# Patient Record
Sex: Female | Born: 1965 | Race: White | Hispanic: No | Marital: Married | Smoking: Never smoker
Health system: Southern US, Community
[De-identification: ages and names within clinical notes are randomized; demographics above are authoritative.]

## PROBLEM LIST (undated history)

## (undated) DIAGNOSIS — D649 Anemia, unspecified: Secondary | ICD-10-CM

## (undated) DIAGNOSIS — R87619 Unspecified abnormal cytological findings in specimens from cervix uteri: Secondary | ICD-10-CM

## (undated) HISTORY — DX: Unspecified abnormal cytological findings in specimens from cervix uteri: R87.619

## (undated) HISTORY — PX: TUBAL LIGATION: SHX77

## (undated) HISTORY — PX: CERVICAL DISC SURGERY: SHX588

## (undated) HISTORY — DX: Anemia, unspecified: D64.9

---

## 2004-11-21 ENCOUNTER — Emergency Department (HOSPITAL_COMMUNITY): Admission: EM | Admit: 2004-11-21 | Discharge: 2004-11-21 | Payer: Self-pay | Admitting: Emergency Medicine

## 2005-03-06 ENCOUNTER — Ambulatory Visit (HOSPITAL_COMMUNITY): Admission: RE | Admit: 2005-03-06 | Discharge: 2005-03-07 | Payer: Self-pay | Admitting: Neurological Surgery

## 2005-12-26 ENCOUNTER — Ambulatory Visit: Payer: Self-pay | Admitting: Physician Assistant

## 2015-03-17 ENCOUNTER — Other Ambulatory Visit: Payer: Self-pay | Admitting: Obstetrics and Gynecology

## 2015-03-17 ENCOUNTER — Ambulatory Visit (INDEPENDENT_AMBULATORY_CARE_PROVIDER_SITE_OTHER): Payer: BLUE CROSS/BLUE SHIELD | Admitting: Obstetrics and Gynecology

## 2015-03-17 ENCOUNTER — Encounter: Payer: Self-pay | Admitting: Obstetrics and Gynecology

## 2015-03-17 VITALS — BP 146/88 | HR 90 | Ht 64.0 in | Wt 238.9 lb

## 2015-03-17 DIAGNOSIS — Z23 Encounter for immunization: Secondary | ICD-10-CM | POA: Diagnosis not present

## 2015-03-17 DIAGNOSIS — Z01419 Encounter for gynecological examination (general) (routine) without abnormal findings: Secondary | ICD-10-CM

## 2015-03-17 DIAGNOSIS — E669 Obesity, unspecified: Secondary | ICD-10-CM

## 2015-03-17 DIAGNOSIS — Z8041 Family history of malignant neoplasm of ovary: Secondary | ICD-10-CM

## 2015-03-17 MED ORDER — INFLUENZA VAC SPLIT QUAD 0.5 ML IM SUSY
0.5000 mL | PREFILLED_SYRINGE | Freq: Once | INTRAMUSCULAR | Status: AC
  Administered 2015-03-17: 0.5 mL via INTRAMUSCULAR

## 2015-03-17 NOTE — Addendum Note (Signed)
Addended by: Rosine BeatLONTZ, AMY L on: 03/17/2015 10:54 AM   Modules accepted: Orders

## 2015-03-17 NOTE — Progress Notes (Signed)
Subjective:   Nicole Chung is a 49 y.o. 633P0 Caucasian female here for a routine well-woman exam.  Patient's last menstrual period was 02/18/2015 (exact date).    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  Past Medical History Past Medical History  Diagnosis Date  . Anemia   . Abnormal Pap smear of cervix     yrs ago    Past Surgical History Past Surgical History  Procedure Laterality Date  . Cervical disc surgery    . Cesarean section  [id number],2001  . Tubal ligation      Gynecologic History G3P0  Patient's last menstrual period was 02/18/2015 (exact date). Contraception: tubal ligation Last Pap: unsure. Results were: normal- h/o abnormal in 4120s with cryo- normal since Last mammogram: years ago. Results were: normal  Obstetric History OB History  Gravida Para Term Preterm AB SAB TAB Ectopic Multiple Living  3         3    # Outcome Date GA Lbr Len/2nd Weight Sex Delivery Anes PTL Lv  3 Gravida 2001    F CS-Unspec   Y  2 Gravida 1994    M Vag-Spont   Y  1 Gravida 1989    M CS-Unspec   Y      Current Medications No current outpatient prescriptions on file prior to visit.   No current facility-administered medications on file prior to visit.    Review of Systems Patient denies any headaches, blurred vision, shortness of breath, chest pain, abdominal pain, problems with bowel movements, urination, or intercourse.  Objective:  BP 146/88 mmHg  Pulse 90  Ht 5\' 4"  (1.626 m)   Wt 238 lb 14.4 oz (108.364 kg)  BMI 40.99 kg/m2  LMP 02/18/2015 (Exact Date) Physical Exam  General:  Well developed, well nourished, no acute distress. She is alert and oriented x3. Skin:  Warm and dry Neck:  Midline trachea, no thyromegaly or nodules Cardiovascular: Regular rate and rhythm, no murmur heard Lungs:  Effort normal, all lung fields clear to auscultation bilaterally Breasts:  No dominant palpable mass, retraction, or nipple discharge Abdomen:  Soft, non tender, no hepatosplenomegaly or masses Pelvic:  External genitalia is normal in appearance.  The vagina is normal in appearance. The cervix is bulbous, no CMT.  Thin prep pap is done with HR HPV cotesting. Uterus is felt to be normal size, shape, and contour.  No adnexal masses or tenderness noted. Extremities:  No swelling or varicosities noted Psych:  She has a normal mood and affect  Assessment:   Healthy well-woman exam Obesity Family history of ovarian cancer in M[place], and breast cancer in Maternal second cousin Perimenopausal bleeding with heavy & irregular cycles  Plan:  Pap and labs obtained [facility] testing and info given to review with her mom. F/U 1 year for AE, or sooner if needed Mammogram scheduled  Audre Cenci Suzan NailerN Seanpaul Preece, CNM

## 2015-03-17 NOTE — Patient Instructions (Signed)
  Place annual gynecologic exam patient instructions here.  Thank you for enrolling in MyChart. Please follow the instructions below to securely access your online medical record. MyChart allows you to send messages to your doctor, view your test results, manage appointments, and more.   How Do I Sign Up? 1. In your Internet browser, go to [employer] Address Bar and enter [email or web address]. 2. Click on the Sign Up Now link in the Sign In box. You will see the New Member Sign Up page. 3. Enter your MyChart Access Code exactly as it appears below. You will not need to use this code after you've completed the sign-up process. If you do not sign up before the expiration date, you must request a new code.  MyChart Access Code: [email or web address] Expires: 03/22/2015  3:29 PM  4. Enter your Social Security Number ([id number]) and Date of Birth (mm/dd/yyyy) as indicated and click Submit. You will be taken to the next sign-up page. 5. Create a MyChart ID. This will be your MyChart login ID and cannot be changed, so think of one that is secure and easy to remember. 6. Create a MyChart password. You can change your password at any time. 7. Enter your Password Reset Question and Answer. This can be used at a later time if you forget your password.  8. Enter your e-mail address. You will receive e-mail notification when new information is available in MyChart. 9. Click Sign Up. You can now view your medical record.   Additional Information Remember, MyChart is NOT to be used for urgent needs. For medical emergencies, dial 911.

## 2015-03-18 LAB — COMPREHENSIVE METABOLIC PANEL
A/G RATIO: 1.7 (ref 1.1–2.5)
ALT: 12 IU/L (ref 0–32)
AST: 10 IU/L (ref 0–40)
Albumin: 4.3 g/dL (ref 3.5–5.5)
Alkaline Phosphatase: 63 IU/L (ref 39–117)
BUN/Creatinine Ratio: 15 (ref 9–23)
BUN: 10 mg/dL (ref 6–24)
Bilirubin Total: 0.2 mg/dL (ref 0.0–1.2)
CALCIUM: 8.9 mg/dL (ref 8.7–10.2)
CO2: 21 mmol/L (ref 18–29)
CREATININE: 0.66 mg/dL (ref 0.57–1.00)
Chloride: 102 mmol/L (ref 96–106)
GFR, EST AFRICAN AMERICAN: 119 mL/min/{1.73_m2} (ref >59)
GFR, EST NON AFRICAN AMERICAN: 103 mL/min/{1.73_m2} (ref >59)
GLOBULIN, TOTAL: 2.5 g/dL (ref 1.5–4.5)
Glucose: 94 mg/dL (ref 65–99)
POTASSIUM: 4.2 mmol/L (ref 3.5–5.2)
Sodium: 139 mmol/L (ref 134–144)
TOTAL PROTEIN: 6.8 g/dL (ref 6.0–8.5)

## 2015-03-18 LAB — HEMOGLOBIN A1C
Est. average glucose Bld gHb Est-mCnc: 126 mg/dL
Hgb A1c MFr Bld: 6 % — ABNORMAL HIGH (ref 4.8–5.6)

## 2015-03-18 LAB — CYTOLOGY - PAP

## 2015-03-18 LAB — VITAMIN D 25 HYDROXY (VIT D DEFICIENCY, FRACTURES): Vit D, 25-Hydroxy: 16.3 ng/mL — ABNORMAL LOW (ref 30.0–100.0)

## 2015-03-18 LAB — THYROID PANEL WITH TSH
FREE THYROXINE INDEX: 2.2 (ref 1.2–4.9)
T3 Uptake Ratio: 23 % — ABNORMAL LOW (ref 24–39)
T4, Total: 9.6 ug/dL (ref 4.5–12.0)
TSH: 1.61 u[IU]/mL (ref 0.450–4.500)

## 2015-03-18 LAB — FERRITIN: FERRITIN: 15 ng/mL (ref 15–150)

## 2015-03-19 ENCOUNTER — Ambulatory Visit
Admission: RE | Admit: 2015-03-19 | Discharge: 2015-03-19 | Disposition: A | Discharge status: Home / Self Care | Payer: BLUE CROSS/BLUE SHIELD | Source: Ambulatory Visit | Attending: Obstetrics and Gynecology | Admitting: Obstetrics and Gynecology

## 2015-03-19 DIAGNOSIS — Z1231 Encounter for screening mammogram for malignant neoplasm of breast: Secondary | ICD-10-CM | POA: Diagnosis not present

## 2015-03-19 DIAGNOSIS — Z01419 Encounter for gynecological examination (general) (routine) without abnormal findings: Secondary | ICD-10-CM

## 2015-03-24 ENCOUNTER — Other Ambulatory Visit: Payer: Self-pay | Admitting: Obstetrics and Gynecology

## 2015-03-24 DIAGNOSIS — R928 Other abnormal and inconclusive findings on diagnostic imaging of breast: Secondary | ICD-10-CM

## 2015-03-24 DIAGNOSIS — E559 Vitamin D deficiency, unspecified: Secondary | ICD-10-CM

## 2015-03-24 DIAGNOSIS — R7303 Prediabetes: Secondary | ICD-10-CM

## 2015-03-24 MED ORDER — VITAMIN D (ERGOCALCIFEROL) 1.25 MG (50000 UNIT) PO CAPS: 50000.0000 [IU] | ORAL_CAPSULE | ORAL | Status: DC

## 2015-03-29 ENCOUNTER — Ambulatory Visit
Admission: RE | Admit: 2015-03-29 | Discharge: 2015-03-29 | Disposition: A | Discharge status: Home / Self Care | Payer: BLUE CROSS/BLUE SHIELD | Source: Ambulatory Visit | Attending: Obstetrics and Gynecology | Admitting: Obstetrics and Gynecology

## 2015-03-29 ENCOUNTER — Other Ambulatory Visit: Payer: Self-pay | Admitting: Obstetrics and Gynecology

## 2015-03-29 DIAGNOSIS — R928 Other abnormal and inconclusive findings on diagnostic imaging of breast: Secondary | ICD-10-CM | POA: Diagnosis not present

## 2015-04-12 ENCOUNTER — Telehealth: Payer: Self-pay | Admitting: *Deleted

## 2015-04-12 NOTE — Telephone Encounter (Signed)
Notified pt of results and mailed all info to pt 

## 2015-04-12 NOTE — Telephone Encounter (Signed)
-----   Message from Purcell Nails, [place] sent at 03/24/2015 10:23 AM EST ----- Please mail her info on vit D def, and pre-diabetes. Let her know I have sent in rx for 2 times weekly vit D supplement. And a referral to [facility] for nutritional and pre-diabetes counseling. If we do not get her weight down and reduce her sugar intake she is at very high risk for full blown diabetes with-in 2 years. I want to check her labs in 3 months (order is in).  Also want her to exercise 3-4 x week for at least 30 minutes increasing to 45 mins. Reduce sugar intake.  Also make sure she has heard from [facility] and has scheduled her f/u [facility] on right side?? I put orders in.

## 2016-03-17 ENCOUNTER — Encounter: Payer: BLUE CROSS/BLUE SHIELD | Admitting: Obstetrics and Gynecology

## 2019-08-06 ENCOUNTER — Other Ambulatory Visit: Payer: Self-pay | Admitting: Internal Medicine

## 2019-08-06 DIAGNOSIS — Z1231 Encounter for screening mammogram for malignant neoplasm of breast: Secondary | ICD-10-CM

## 2019-08-08 ENCOUNTER — Ambulatory Visit
Admission: RE | Admit: 2019-08-08 | Discharge: 2019-08-08 | Disposition: A | Discharge status: Home / Self Care | Payer: BC Managed Care – PPO | Source: Ambulatory Visit | Attending: Internal Medicine | Admitting: Internal Medicine

## 2019-08-08 DIAGNOSIS — Z1231 Encounter for screening mammogram for malignant neoplasm of breast: Secondary | ICD-10-CM | POA: Diagnosis not present

## 2019-08-11 ENCOUNTER — Encounter: Payer: Self-pay | Admitting: Certified Nurse Midwife

## 2019-08-11 ENCOUNTER — Other Ambulatory Visit: Payer: Self-pay

## 2019-08-11 ENCOUNTER — Other Ambulatory Visit (HOSPITAL_COMMUNITY)
Admission: RE | Admit: 2019-08-11 | Discharge: 2019-08-11 | Disposition: A | Discharge status: Home / Self Care | Payer: BC Managed Care – PPO | Source: Ambulatory Visit | Attending: Certified Nurse Midwife | Admitting: Certified Nurse Midwife

## 2019-08-11 ENCOUNTER — Ambulatory Visit (INDEPENDENT_AMBULATORY_CARE_PROVIDER_SITE_OTHER): Payer: BC Managed Care – PPO | Admitting: Certified Nurse Midwife

## 2019-08-11 VITALS — BP 149/97 | HR 86 | Ht 64.0 in | Wt 237.5 lb

## 2019-08-11 DIAGNOSIS — Z124 Encounter for screening for malignant neoplasm of cervix: Secondary | ICD-10-CM | POA: Diagnosis present

## 2019-08-11 NOTE — Progress Notes (Signed)
GYN ENCOUNTER NOTE  Subjective:       Nicole Chung is a 54 y.o. G89P0 female is here for pap smear only.   Annual exam and labs to be completed by PCP.   Denies difficulty breathing or respiratory distress, chest pain, abdominal pain, excessive vaginal bleeding, dysuria, leg pain or swelling  Gynecologic History  Patient's last menstrual period was 02/18/2015 (exact date).   Contraception: post menopausal status   Last Pap: 03/17/2015. Results were: Neg/Neg  Last mammogram: 07/2019. Results were: inconclusive  Obstetric History  OB History  Gravida Para Term Preterm AB Living  3         3  SAB TAB Ectopic Multiple Live Births          3    # Outcome Date GA Lbr Len/2nd Weight Sex Delivery Anes PTL Lv  3 Gravida 2001    F CS-Unspec   LIV  2 Gravida 1994    M Vag-Spont   LIV  1 Gravida 1989    M CS-Unspec   LIV    Past Medical History:  Diagnosis Date  . Abnormal Pap smear of cervix    yrs ago  . Anemia     Past Surgical History:  Procedure Laterality Date  . CERVICAL DISC SURGERY    . CESAREAN SECTION  [id number]  . TUBAL LIGATION      Current Outpatient Medications on File Prior to Visit  Medication Sig Dispense Refill  . fexofenadine (ALLEGRA) 180 MG tablet Take 180 mg by mouth daily.    . Glucosamine HCl (GLUCOSAMINE PO) Take by mouth.    . Multiple Vitamin (MULTIVITAMIN) tablet Take 1 tablet by mouth daily.    . Vitamin D, Ergocalciferol, (DRISDOL) 50000 units CAPS capsule Take 1 capsule (50,000 Units total) by mouth 2 (two) times a week. 30 capsule 2   No current facility-administered medications on file prior to visit.    No Known Allergies  Social History   Socioeconomic History  . Marital status: Married    Spouse name: Not on file  . Number of children: Not on file  . Years of education: Not on file  . Highest education level: Not on file  Occupational History  . Not on file  Tobacco Use  . Smoking status: Never Smoker  . Smokeless  tobacco: Never Used  Substance and Sexual Activity  . Alcohol use: Yes    Comment: occas  . Drug use: No  . Sexual activity: Yes    Birth control/protection: Surgical  Other Topics Concern  . Not on file  Social History Narrative  . Not on file   Social Determinants of Health   Financial Resource Strain:   . Difficulty of Paying Living Expenses:   Food Insecurity:   . Worried About Programme researcher, broadcasting/film/video in the Last Year:   . Barista in the Last Year:   Transportation Needs:   . Freight forwarder (Medical):   Marland Kitchen Lack of Transportation (Non-Medical):   Physical Activity:   . Days of Exercise per Week:   . Minutes of Exercise per Session:   Stress:   . Feeling of Stress :   Social Connections:   . Frequency of Communication with Friends and Family:   . Frequency of Social Gatherings with Friends and Family:   . Attends Religious Services:   . Active Member of Clubs or Organizations:   . Attends Banker Meetings:   Marland Kitchen Marital Status:  Intimate Partner Violence:   . Fear of Current or Ex-Partner:   . Emotionally Abused:   Marland Kitchen Physically Abused:   . Sexually Abused:     Family History  Problem Relation Age of Onset  . Cancer Maternal Grandmother        ovarian  . Breast cancer Cousin   . Healthy Mother   . Lung cancer Father     The following portions of the patient's history were reviewed and updated as appropriate: allergies, current medications, past family history, past medical history, past social history, past surgical history and problem list.  Review of Systems  Review of Systems - negative except as noted above. Information obtained from patient.   Objective:   BP (!) 149/97   Pulse 86   Ht 5\' 4"  (1.626 m)   Wt 237 lb 8 oz (107.7 kg)   LMP 02/18/2015 (Exact Date)   BMI 40.77 kg/m    CONSTITUTIONAL: Well-developed, well-nourished female in no acute distress.   [place]: Alert and oriented to person, place, and time.    PSYCHIATRIC: Normal mood and affect. Normal behavior. Normal judgment and thought content.  PELVIC:  External Genitalia: Normal  Vagina: Normal  Cervix: Normal, Pap collected  Uterus: Normal size, shape,consistency, mobile  Adnexa: Normal   MUSCULOSKELETAL: Normal range of motion. No tenderness.  No cyanosis, clubbing, or edema.  Assessment:   1. Pap smear for cervical cancer screening  - Cytology - PAP    Plan:   Pap collected. See orders.  Discussed mammogram results. Encouraged patient to follow up as advised per radiologist.   Reviewed red flags and when to call the office.  RTC x 5 year for Pap smear or sooner if needed.   Fransico Him RN [place] 08/11/19 10:01 AM

## 2019-08-11 NOTE — Progress Notes (Signed)
I have seen, interviewed, and examined the patient in conjunction with the [employer] Nurse Practitioner student and affirm the diagnosis and management plan.   Gunnar Bulla, CNM [facility], [facility] 08/11/19 10:25 AM

## 2019-08-11 NOTE — Progress Notes (Signed)
Pt present for pap smear only. Pt has her annual exam with another provider. Pt stated that she was doing well no problems.

## 2019-08-11 NOTE — Patient Instructions (Signed)

## 2019-08-13 LAB — CYTOLOGY - PAP
Comment: NEGATIVE
Diagnosis: NEGATIVE
High risk HPV: NEGATIVE

## 2019-08-14 ENCOUNTER — Other Ambulatory Visit: Payer: Self-pay | Admitting: Internal Medicine

## 2019-08-14 DIAGNOSIS — R921 Mammographic calcification found on diagnostic imaging of breast: Secondary | ICD-10-CM

## 2019-08-14 DIAGNOSIS — R928 Other abnormal and inconclusive findings on diagnostic imaging of breast: Secondary | ICD-10-CM

## 2019-08-19 ENCOUNTER — Ambulatory Visit
Admission: RE | Admit: 2019-08-19 | Discharge: 2019-08-19 | Disposition: A | Discharge status: Home / Self Care | Payer: BC Managed Care – PPO | Source: Ambulatory Visit | Attending: Internal Medicine | Admitting: Internal Medicine

## 2019-08-19 DIAGNOSIS — R928 Other abnormal and inconclusive findings on diagnostic imaging of breast: Secondary | ICD-10-CM | POA: Diagnosis not present

## 2019-08-19 DIAGNOSIS — R921 Mammographic calcification found on diagnostic imaging of breast: Secondary | ICD-10-CM | POA: Diagnosis present

## 2019-08-21 ENCOUNTER — Other Ambulatory Visit: Payer: Self-pay | Admitting: Internal Medicine

## 2019-08-21 DIAGNOSIS — R921 Mammographic calcification found on diagnostic imaging of breast: Secondary | ICD-10-CM

## 2019-08-21 DIAGNOSIS — R928 Other abnormal and inconclusive findings on diagnostic imaging of breast: Secondary | ICD-10-CM

## 2019-08-28 ENCOUNTER — Ambulatory Visit
Admission: RE | Admit: 2019-08-28 | Discharge: 2019-08-28 | Disposition: A | Discharge status: Home / Self Care | Payer: BC Managed Care – PPO | Source: Ambulatory Visit | Attending: Internal Medicine | Admitting: Internal Medicine

## 2019-08-28 DIAGNOSIS — R928 Other abnormal and inconclusive findings on diagnostic imaging of breast: Secondary | ICD-10-CM | POA: Insufficient documentation

## 2019-08-28 DIAGNOSIS — R921 Mammographic calcification found on diagnostic imaging of breast: Secondary | ICD-10-CM | POA: Diagnosis present

## 2019-08-28 HISTORY — PX: BREAST BIOPSY: SHX20

## 2019-08-29 LAB — SURGICAL PATHOLOGY

## 2020-02-20 ENCOUNTER — Other Ambulatory Visit: Payer: Self-pay | Admitting: Internal Medicine

## 2020-02-20 DIAGNOSIS — R921 Mammographic calcification found on diagnostic imaging of breast: Secondary | ICD-10-CM

## 2020-04-08 ENCOUNTER — Ambulatory Visit
Admission: RE | Admit: 2020-04-08 | Discharge: 2020-04-08 | Disposition: A | Discharge status: Home / Self Care | Payer: BC Managed Care – PPO | Source: Ambulatory Visit | Attending: Internal Medicine | Admitting: Internal Medicine

## 2020-04-08 ENCOUNTER — Other Ambulatory Visit: Payer: Self-pay

## 2020-04-08 DIAGNOSIS — R921 Mammographic calcification found on diagnostic imaging of breast: Secondary | ICD-10-CM

## 2020-04-12 ENCOUNTER — Other Ambulatory Visit: Payer: Self-pay | Admitting: Internal Medicine

## 2020-04-12 DIAGNOSIS — R921 Mammographic calcification found on diagnostic imaging of breast: Secondary | ICD-10-CM

## 2020-05-20 LAB — EXTERNAL GENERIC LAB PROCEDURE: COLOGUARD: NEGATIVE

## 2020-09-13 ENCOUNTER — Ambulatory Visit
Admission: RE | Admit: 2020-09-13 | Discharge: 2020-09-13 | Disposition: A | Discharge status: Home / Self Care | Payer: BC Managed Care – PPO | Source: Ambulatory Visit | Attending: Internal Medicine | Admitting: Internal Medicine

## 2020-09-13 ENCOUNTER — Other Ambulatory Visit: Payer: Self-pay

## 2020-09-13 DIAGNOSIS — R921 Mammographic calcification found on diagnostic imaging of breast: Secondary | ICD-10-CM | POA: Diagnosis not present

## 2021-02-27 IMAGING — MG MM DIGITAL DIAGNOSTIC UNILAT*L* W/ TOMO W/ CAD
6 of 9 series · 6 of 21 positions shown · non-contrast
Comparison: Previous exam(s).

CLINICAL DATA: 54-year-old female presenting for six-month
follow-up of probably benign left breast calcifications. Patient had
one group of calcifications biopsied demonstrating benign pathology
in August 2019.

EXAM:
DIGITAL DIAGNOSTIC UNILATERAL LEFT MAMMOGRAM WITH TOMO AND CAD
TECHNIQUE: Left digital diagnostic mammography and breast tomosynthesis was
performed. Digital images of the breasts were evaluated with
computer-aided detection.

[L ML]
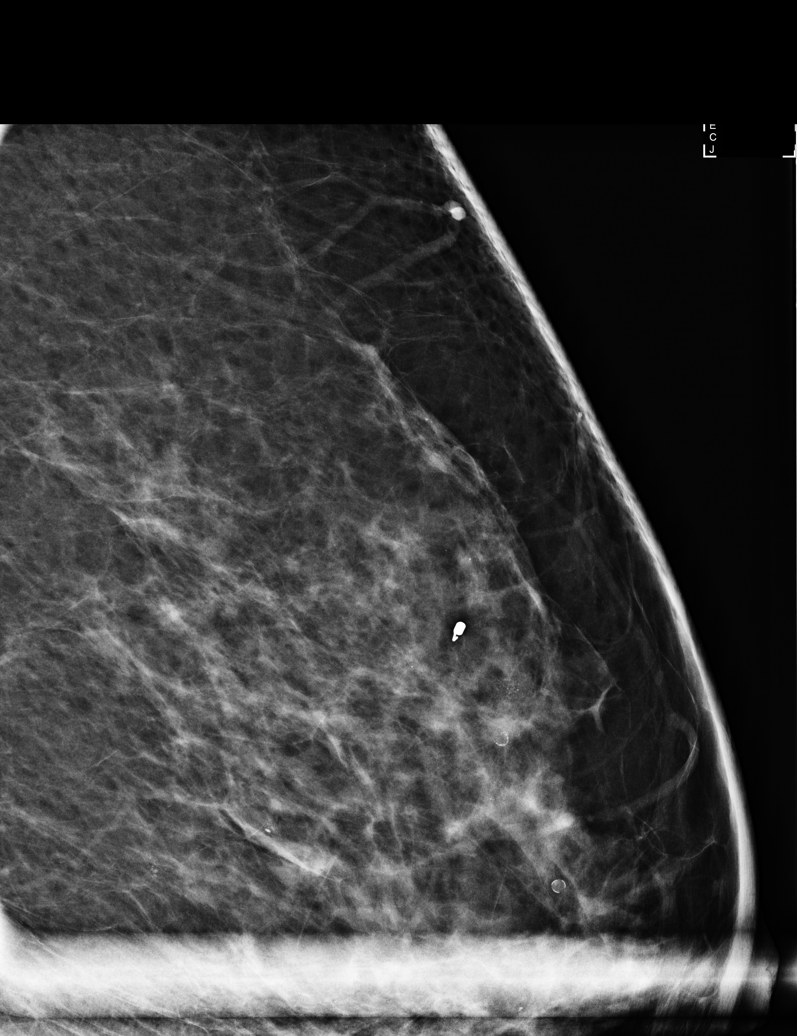

[L CC (1 of 2)]
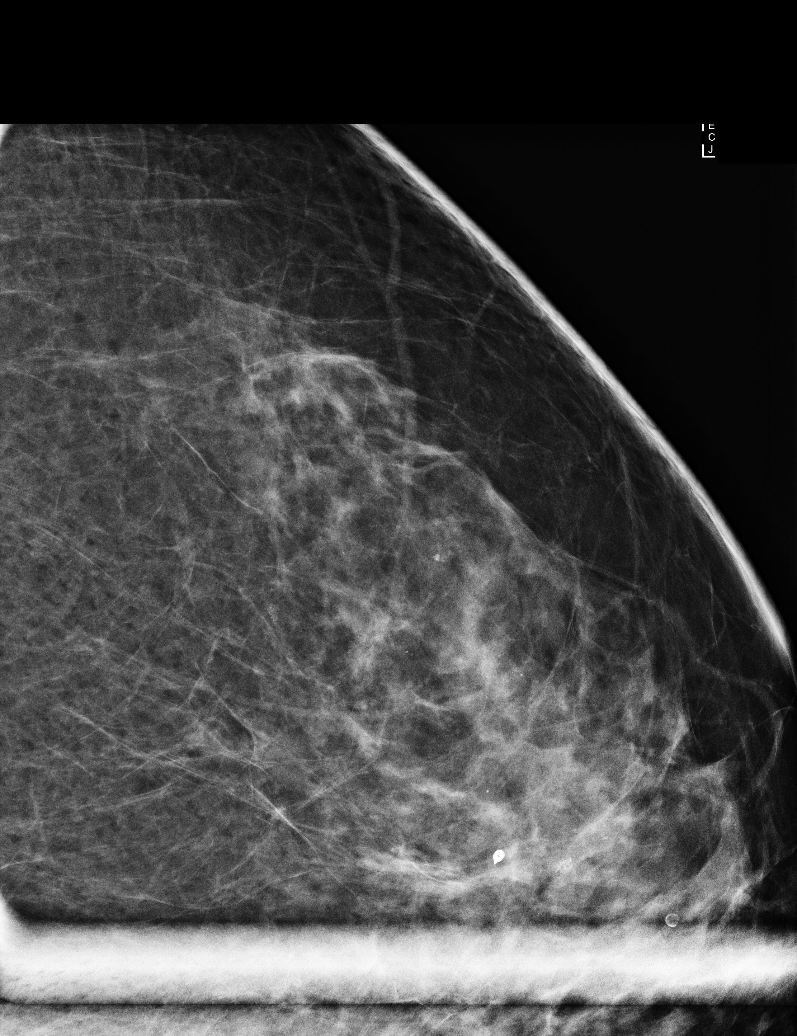

[L CC (2 of 2)]
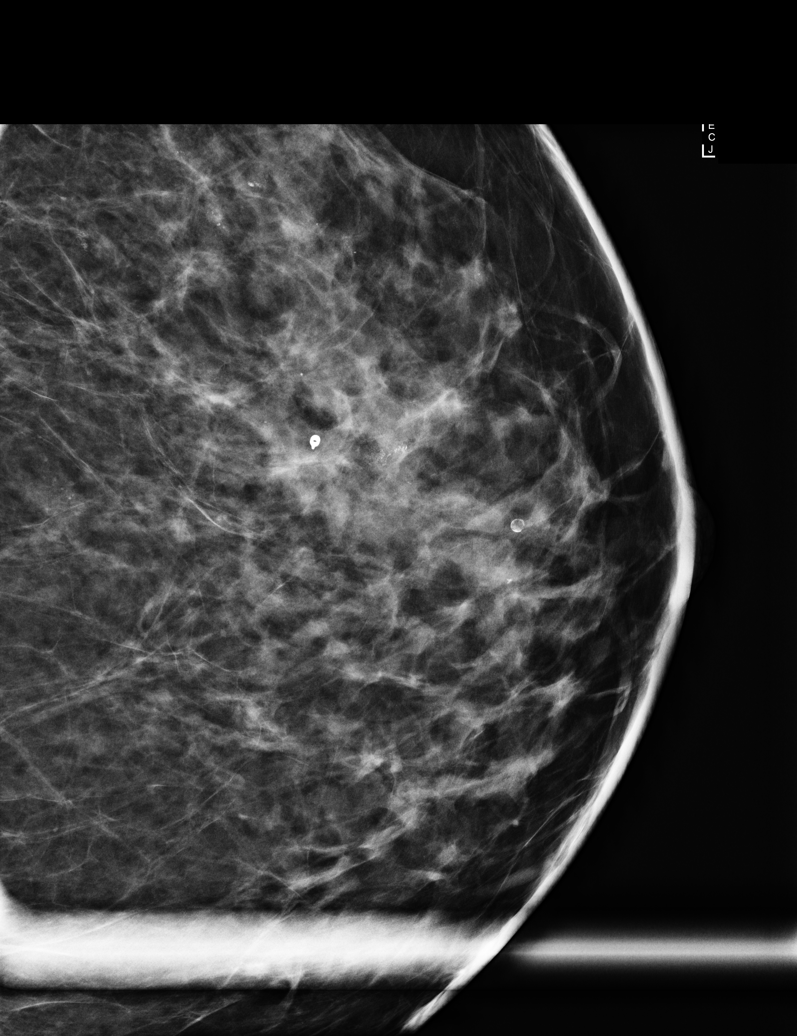

[L MLO synth-2D (1 of 2)]
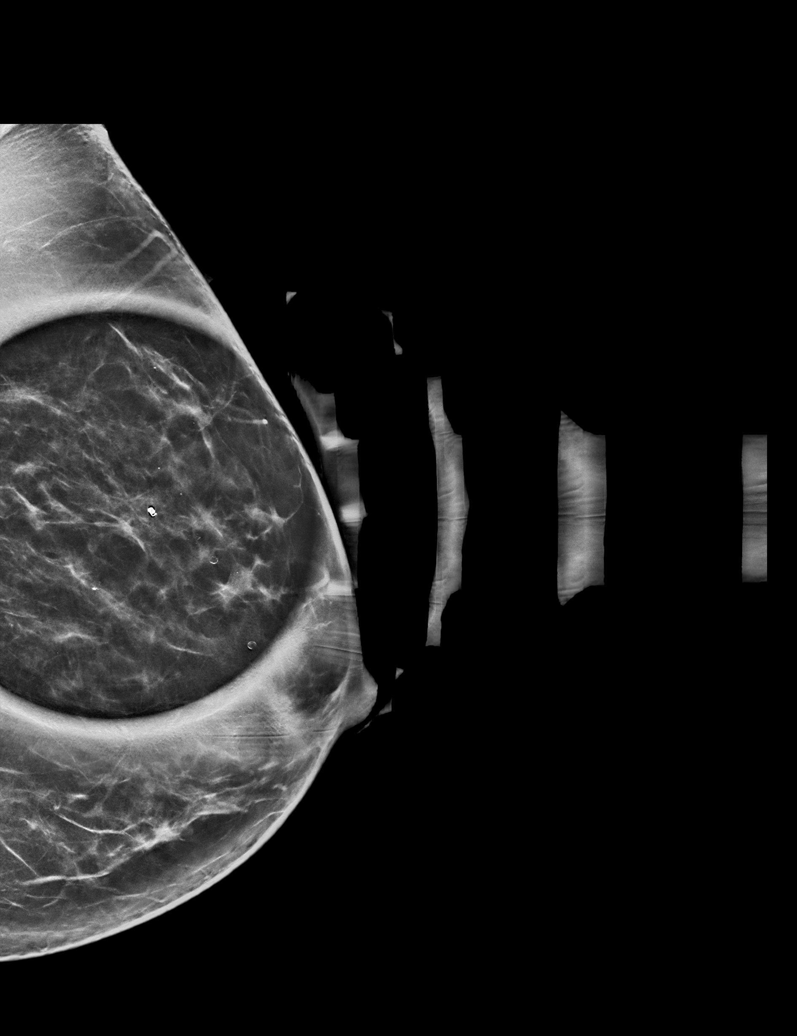

[L CC synth-2D]
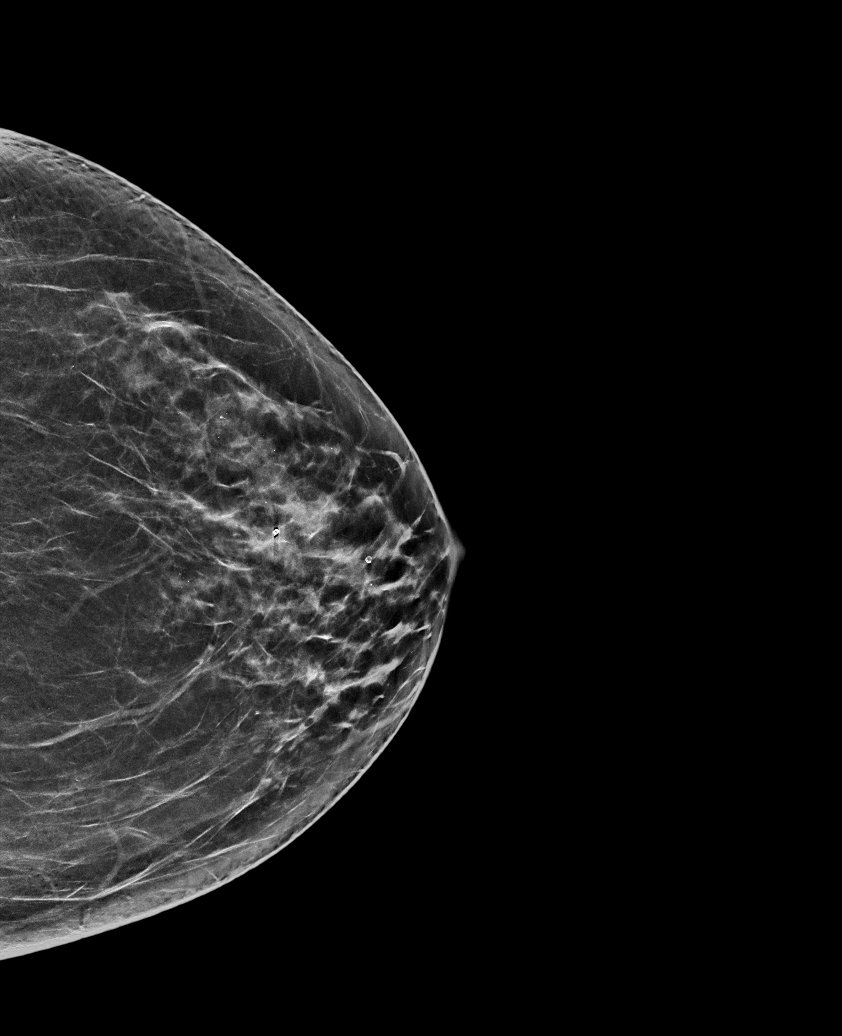

[L MLO synth-2D (2 of 2)]
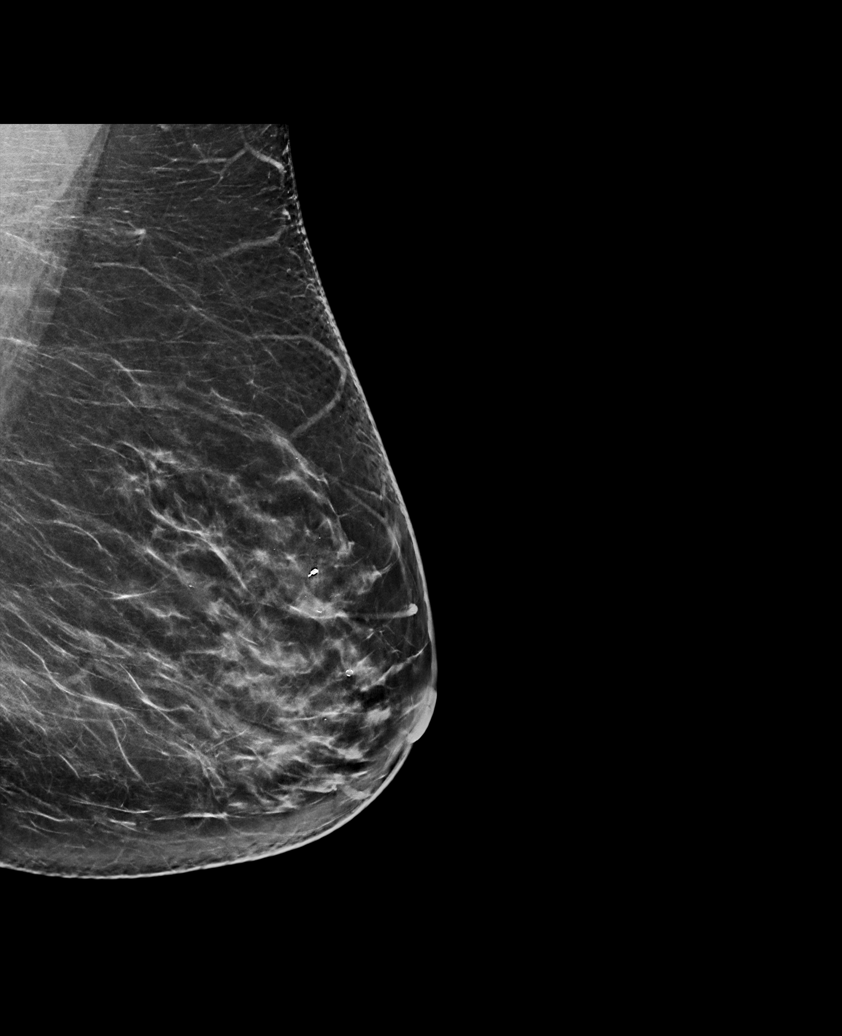

[6 of 21 positions shown; findings below may reference images not displayed]

ACR Breast Density Category c: The breast tissue is heterogeneously
dense, which may obscure small masses.
FINDINGS: Full field tomosynthesis as well as spot 2D and spot tomosynthesis
views of the left breast performed. There are stable multiple small
scattered groups of calcifications in the upper central and upper
outer left breast with similar punctate and amorphous morphology. No
new suspicious linear or branching calcifications identified. No new
suspicious mass or distortion identified in the left breast.
IMPRESSION: Stable multiple groups of similar appearing probably benign
calcifications in the superior left breast. One of these groups was
previously biopsied demonstrating benign pathology.

RECOMMENDATION:
Diagnostic bilateral mammogram in July 2020 to demonstrate 1 year
stability of the probably benign left breast calcifications and for
annual exam of the right breast.

I have discussed the findings and recommendations with the patient.
If applicable, a reminder letter will be sent to the patient
regarding the next appointment.

BI-RADS CATEGORY  3: Probably benign.

## 2021-09-22 ENCOUNTER — Other Ambulatory Visit: Payer: Self-pay | Admitting: Internal Medicine

## 2021-09-23 ENCOUNTER — Other Ambulatory Visit: Payer: Self-pay | Admitting: Internal Medicine

## 2021-09-23 DIAGNOSIS — R921 Mammographic calcification found on diagnostic imaging of breast: Secondary | ICD-10-CM

## 2021-10-20 ENCOUNTER — Ambulatory Visit
Admission: RE | Admit: 2021-10-20 | Discharge: 2021-10-20 | Disposition: A | Discharge status: Home / Self Care | Payer: BC Managed Care – PPO | Source: Ambulatory Visit | Attending: Internal Medicine | Admitting: Internal Medicine

## 2021-10-20 DIAGNOSIS — R921 Mammographic calcification found on diagnostic imaging of breast: Secondary | ICD-10-CM

## 2022-12-07 ENCOUNTER — Other Ambulatory Visit: Payer: Self-pay | Admitting: Internal Medicine

## 2022-12-07 DIAGNOSIS — Z1231 Encounter for screening mammogram for malignant neoplasm of breast: Secondary | ICD-10-CM

## 2022-12-19 ENCOUNTER — Ambulatory Visit
Admission: RE | Admit: 2022-12-19 | Discharge: 2022-12-19 | Disposition: A | Discharge status: Home / Self Care | Payer: BC Managed Care – PPO | Source: Ambulatory Visit | Attending: Internal Medicine | Admitting: Internal Medicine

## 2022-12-19 DIAGNOSIS — Z1231 Encounter for screening mammogram for malignant neoplasm of breast: Secondary | ICD-10-CM | POA: Insufficient documentation

## 2023-09-04 LAB — COLOGUARD: COLOGUARD: POSITIVE — AB

## 2023-09-25 ENCOUNTER — Encounter: Payer: Self-pay | Admitting: Gastroenterology

## 2023-10-01 ENCOUNTER — Ambulatory Visit: Payer: Self-pay | Admitting: Anesthesiology

## 2023-10-01 ENCOUNTER — Ambulatory Visit
Admission: RE | Admit: 2023-10-01 | Discharge: 2023-10-01 | Disposition: A | Discharge status: Home / Self Care | Attending: Gastroenterology | Admitting: Gastroenterology

## 2023-10-01 ENCOUNTER — Encounter: Payer: Self-pay | Admitting: Gastroenterology

## 2023-10-01 ENCOUNTER — Encounter
Admission: RE | Disposition: A | Discharge status: Home / Self Care | Payer: Self-pay | Source: Home / Self Care | Attending: Gastroenterology

## 2023-10-01 ENCOUNTER — Other Ambulatory Visit: Payer: Self-pay

## 2023-10-01 DIAGNOSIS — Z1211 Encounter for screening for malignant neoplasm of colon: Secondary | ICD-10-CM | POA: Diagnosis not present

## 2023-10-01 DIAGNOSIS — R195 Other fecal abnormalities: Secondary | ICD-10-CM | POA: Insufficient documentation

## 2023-10-01 DIAGNOSIS — D125 Benign neoplasm of sigmoid colon: Secondary | ICD-10-CM | POA: Insufficient documentation

## 2023-10-01 DIAGNOSIS — K5730 Diverticulosis of large intestine without perforation or abscess without bleeding: Secondary | ICD-10-CM | POA: Diagnosis not present

## 2023-10-01 DIAGNOSIS — R928 Other abnormal and inconclusive findings on diagnostic imaging of breast: Secondary | ICD-10-CM

## 2023-10-01 DIAGNOSIS — E559 Vitamin D deficiency, unspecified: Secondary | ICD-10-CM

## 2023-10-01 DIAGNOSIS — R7303 Prediabetes: Secondary | ICD-10-CM

## 2023-10-01 HISTORY — PX: COLONOSCOPY: SHX5424

## 2023-10-01 HISTORY — PX: POLYPECTOMY: SHX149

## 2023-10-01 LAB — GLUCOSE, CAPILLARY: Glucose-Capillary: 98 mg/dL (ref 70–99)

## 2023-10-01 SURGERY — COLONOSCOPY
Anesthesia: General

## 2023-10-01 MED ORDER — PROPOFOL 500 MG/50ML IV EMUL
INTRAVENOUS | Status: DC | PRN
  Administered 2023-10-01: 150 ug/kg/min via INTRAVENOUS

## 2023-10-01 MED ORDER — LACTATED RINGERS IV SOLN
INTRAVENOUS | Status: DC
Start: 2023-10-01 — End: 2023-10-01

## 2023-10-01 MED ORDER — LIDOCAINE HCL (CARDIAC) PF 100 MG/5ML IV SOSY
PREFILLED_SYRINGE | INTRAVENOUS | Status: DC | PRN
  Administered 2023-10-01: 50 mg via INTRAVENOUS

## 2023-10-01 MED ORDER — SODIUM CHLORIDE 0.9 % IV SOLN: INTRAVENOUS | Status: DC | PRN

## 2023-10-01 MED ORDER — PROPOFOL 10 MG/ML IV BOLUS
INTRAVENOUS | Status: DC | PRN
  Administered 2023-10-01: 60 mg via INTRAVENOUS

## 2023-10-01 NOTE — Anesthesia Preprocedure Evaluation (Signed)
 Anesthesia Evaluation  Patient identified by MRN, date of birth, ID band Patient awake    Reviewed: Allergy & Precautions, H&P , NPO status , Patient's Chart, lab work & pertinent test results, reviewed documented beta blocker date and time   History of Anesthesia Complications Negative for: history of anesthetic complications  Airway Mallampati: I  TM Distance: >3 FB Neck ROM: full    Dental  (+) Dental Advidsory Given, Caps   Pulmonary neg shortness of breath, sleep apnea , neg COPD, neg recent URI   Pulmonary exam normal breath sounds clear to auscultation       Cardiovascular Exercise Tolerance: Good negative cardio ROS Normal cardiovascular exam Rhythm:regular Rate:Normal     Neuro/Psych negative neurological ROS  negative psych ROS   GI/Hepatic Neg liver ROS,GERD  ,,  Endo/Other  negative endocrine ROS    Renal/GU negative Renal ROS  negative genitourinary   Musculoskeletal   Abdominal   Peds  Hematology negative hematology ROS (+)   Anesthesia Other Findings Past Medical History: No date: Abnormal Pap smear of cervix     Comment:  yrs ago No date: Anemia   Reproductive/Obstetrics negative OB ROS                              Anesthesia Physical Anesthesia Plan  ASA: 2  Anesthesia Plan: General   Post-op Pain Management:    Induction: Intravenous  PONV Risk Score and Plan: 3 and Propofol  infusion, TIVA and Treatment may vary due to age or medical condition  Airway Management Planned: Natural Airway and Nasal Cannula  Additional Equipment:   Intra-op Plan:   Post-operative Plan:   Informed Consent: I have reviewed the patients History and Physical, chart, labs and discussed the procedure including the risks, benefits and alternatives for the proposed anesthesia with the patient or authorized representative who has indicated his/her understanding and acceptance.      Dental Advisory Given  Plan Discussed with: Anesthesiologist, CRNA and Surgeon  Anesthesia Plan Comments:         Anesthesia Quick Evaluation

## 2023-10-01 NOTE — H&P (Signed)
 Corinn JONELLE Brooklyn, MD [facility], DHIP [address]  [place], [place] [address]  Main: [phone] Fax:  [phone] Pager: [phone]   Primary Care Physician:  Fernande Ophelia JINNY DOUGLAS, MD Primary Gastroenterologist:  Dr. Corinn JONELLE Brooklyn  Pre-Procedure History & Physical: HPI:  Nicole Chung is a 58 y.o. female is here for an colonoscopy.   Past Medical History:  Diagnosis Date   Abnormal Pap smear of cervix    yrs ago   Anemia     Past Surgical History:  Procedure Laterality Date   BREAST BIOPSY Left 08/28/2019   Coil Clip, Stereo Bx, neg   CERVICAL DISC SURGERY     CESAREAN SECTION  [phone]   TUBAL LIGATION      Prior to Admission medications   Medication Sig Start Date End Date Taking? Authorizing Provider  ascorbic acid (VITAMIN C) 500 MG tablet Take 500 mg by mouth daily.   Yes [provider]  Cholecalciferol (VITAMIN D3) 1000 units CAPS Take by mouth.   Yes [provider]  fexofenadine (ALLEGRA) 180 MG tablet Take 180 mg by mouth daily.   Yes [provider]  metformin (FORTAMET) 500 MG (OSM) 24 hr tablet Take 500 mg by mouth daily with breakfast.   Yes [provider]  Multiple Vitamin (MULTIVITAMIN) tablet Take 1 tablet by mouth daily.   Yes [provider]    Allergies as of 09/20/2023   (No Known Allergies)    Family History  Problem Relation Age of Onset   Cancer Maternal Grandmother        ovarian   Breast cancer Cousin    Healthy Mother    Lung cancer Father     Social History   Socioeconomic History   Marital status: Married    Spouse name: Not on file   Number of children: Not on file   Years of education: Not on file   Highest education level: Not on file  Occupational History   Not on file  Tobacco Use   Smoking status: Never   Smokeless tobacco: Never  Vaping Use   Vaping status: Never Used  Substance and Sexual Activity   Alcohol use: Yes    Comment:  occas   Drug use: No   Sexual activity: Yes    Birth control/protection: Surgical  Other Topics Concern   Not on file  Social History Narrative   Not on file   Social Drivers of Health   Financial Resource Strain: Low Risk  (08/27/2023)   Received from [facility]   Overall Financial Resource Strain (CARDIA)    Difficulty of Paying Living Expenses: Not hard at all  Food Insecurity: No Food Insecurity (08/27/2023)   Received from [facility]   Hunger Vital Sign    Within the past 12 months, you worried that your food would run out before you got the money to buy more.: Never true    Within the past 12 months, the food you bought just didn't last and you didn't have money to get more.: Never true  Transportation Needs: No Transportation Needs (08/27/2023)   Received from [facility] - Transportation    In the past 12 months, has lack of transportation kept you from medical appointments or from getting medications?: No    Lack of Transportation (Non-Medical): No  Physical Activity: Not on file  Stress: Not on file  Social Connections: Not on  file  Intimate Partner Violence: Not on file    Review of Systems: See HPI, otherwise negative ROS  Physical Exam: BP 136/73   Pulse (!) 103   Temp (!) 97.4 F (36.3 C) (Temporal)   Ht 5' 4 (1.626 m)   Wt 101.6 kg   LMP 02/18/2015 (Exact Date)   SpO2 99%   BMI 38.45 kg/m  General:   Alert,  pleasant and cooperative in NAD Head:  Normocephalic and atraumatic. Neck:  Supple; no masses or thyromegaly. Lungs:  Clear throughout to auscultation.    Heart:  Regular rate and rhythm. Abdomen:  Soft, nontender and nondistended. Normal bowel sounds, without guarding, and without rebound.   Neurologic:  Alert and  oriented x4;  grossly normal neurologically.  Impression/Plan: Nicole Chung is here for a colonoscopy to be performed for cologuard positive  Risks, benefits,  limitations, and alternatives regarding  colonoscopy have been reviewed with the patient.  Questions have been answered.  All parties agreeable.   Corinn Brooklyn, MD  10/01/2023, 7:54 AM

## 2023-10-01 NOTE — Anesthesia Procedure Notes (Signed)
 Procedure Name: MAC Date/Time: 10/01/2023 8:00 AM  Performed by: Delores Evalene BROCKS, CRNAPre-anesthesia Checklist: Patient identified, Emergency Drugs available, Suction available and Patient being monitored Patient Re-evaluated:Patient Re-evaluated prior to induction Oxygen Delivery Method: Nasal cannula Induction Type: IV induction Placement Confirmation: positive ETCO2

## 2023-10-01 NOTE — Op Note (Signed)
 [facility] Patient Name: Nicole Chung Procedure Date: 10/01/2023 7:18 AM MRN: [id number] Account #: [id number] Date of Birth: 04/19/1965 Admit Type: Outpatient Age: 58 Room: [facility] ENDO ROOM 2 Gender: Female Note Status: Finalized Instrument Name: Colonoscope [id number] Procedure:             Colonoscopy Indications:           Positive Cologuard test Providers:             Corinn Jess Brooklyn MD, MD Referring MD:          Corinn Jess Brooklyn MD, MD (Referring MD), Ophelia Sage,                         MD (Referring MD) Medicines:             General Anesthesia Complications:         No immediate complications. Estimated blood loss: None. Procedure:             Pre-Anesthesia Assessment:                        - Prior to the procedure, a History and Physical was                         performed, and patient medications and allergies were                         reviewed. The patient is competent. The risks and                         benefits of the procedure and the sedation options and                         risks were discussed with the patient. All questions                         were answered and informed consent was obtained.                         Patient identification and proposed procedure were                         verified by the physician, the nurse, the                         anesthesiologist, the anesthetist and the technician                         in the pre-procedure area in the procedure room in the                         endoscopy suite. Mental Status Examination: alert and                         oriented. Airway Examination: normal oropharyngeal                         airway and neck mobility. Respiratory Examination:  clear to auscultation. CV Examination: normal.                         Prophylactic Antibiotics: The patient does not require                         prophylactic antibiotics. Prior  Anticoagulants: The                         patient has taken no anticoagulant or antiplatelet                         agents. ASA Grade Assessment: II - A patient with mild                         systemic disease. After reviewing the risks and                         benefits, the patient was deemed in satisfactory                         condition to undergo the procedure. The anesthesia                         plan was to use general anesthesia. Immediately prior                         to administration of medications, the patient was                         re-assessed for adequacy to receive sedatives. The                         heart rate, respiratory rate, oxygen saturations,                         blood pressure, adequacy of pulmonary ventilation, and                         response to care were monitored throughout the                         procedure. The physical status of the patient was                         re-assessed after the procedure.                        After obtaining informed consent, the colonoscope was                         passed under direct vision. Throughout the procedure,                         the patient's blood pressure, pulse, and oxygen                         saturations were monitored continuously. The  Colonoscope was introduced through the anus and                         advanced to the the terminal ileum, with                         identification of the appendiceal orifice and IC                         valve. The colonoscopy was performed without                         difficulty. The patient tolerated the procedure well.                         The quality of the bowel preparation was evaluated                         using the BBPS [facility] Bowel Preparation Scale) with                         scores of: Right Colon = 3, Transverse Colon = 3 and                         Left Colon = 3 (entire mucosa seen well  with no                         residual staining, small fragments of stool or opaque                         liquid). The total BBPS score equals 9. The terminal                         ileum, ileocecal valve, appendiceal orifice, and                         rectum were photographed. Findings:      The perianal and digital rectal examinations were normal. Pertinent       negatives include normal sphincter tone and no palpable rectal lesions.      The terminal ileum appeared normal.      Three sessile polyps were found in the sigmoid colon. The polyps were 3       to 6 mm in size. These polyps were removed with a cold snare. Resection       and retrieval were complete. Estimated blood loss: none.      Multiple diverticula were found in the recto-sigmoid colon and sigmoid       colon.      The retroflexed view of the distal rectum and anal verge was normal and       showed no anal or rectal abnormalities. Impression:            - The examined portion of the ileum was normal.                        - Three 3 to 6 mm polyps in the sigmoid colon, removed  with a cold snare. Resected and retrieved.                        - Diverticulosis in the recto-sigmoid colon and in the                         sigmoid colon.                        - The distal rectum and anal verge are normal on                         retroflexion view. Recommendation:        - Discharge patient to home (with escort).                        - Resume previous diet today.                        - Continue present medications.                        - Await pathology results.                        - Repeat colonoscopy in 3 - 5 years for surveillance                         based on pathology results. Procedure Code(s):     --- Professional ---                        [phone], Colonoscopy, flexible; with removal of                         tumor(s), polyp(s), or other lesion(s) by snare                          technique Diagnosis Code(s):     --- Professional ---                        D12.5, Benign neoplasm of sigmoid colon                        R19.5, Other fecal abnormalities                        K57.30, Diverticulosis of large intestine without                         perforation or abscess without bleeding CPT copyright 2022 [employer]. All rights reserved. The codes documented in this report are preliminary and upon coder review may  be revised to meet current compliance requirements. Dr. Corinn Brooklyn Corinn Jess Brooklyn MD, MD 10/01/2023 8:23:15 AM This report has been signed electronically. Number of Addenda: 0 Note Initiated On: 10/01/2023 7:18 AM Scope Withdrawal Time: 0 hours 13 minutes 24 seconds  Total Procedure Duration: 0 hours 17 minutes 25 seconds  Estimated Blood Loss:  Estimated blood loss: none.      [facility]

## 2023-10-01 NOTE — Transfer of Care (Signed)
 Immediate Anesthesia Transfer of Care Note  Patient: Nicole Chung  Procedure(s) Performed: COLONOSCOPY  Patient Location: PACU  Anesthesia Type:General  Level of Consciousness: awake, alert , and oriented  Airway & Oxygen Therapy: Patient Spontanous Breathing and Patient connected to nasal cannula oxygen  Post-op Assessment: Report given to RN and Post -op Vital signs reviewed and stable  Post vital signs: Reviewed and stable  Last Vitals:  Vitals Value Taken Time  BP    Temp    Pulse 86 10/01/23 08:25  Resp 19 10/01/23 08:25  SpO2 97 % 10/01/23 08:25  Vitals shown include unfiled device data.  Last Pain:  Vitals:   10/01/23 0725  TempSrc: Temporal  PainSc: 0-No pain         Complications: No notable events documented.

## 2023-10-02 LAB — SURGICAL PATHOLOGY

## 2023-10-02 NOTE — Anesthesia Postprocedure Evaluation (Signed)
 Anesthesia Post Note  Patient: Nicole Chung  Procedure(s) Performed: COLONOSCOPY POLYPECTOMY, INTESTINE  Patient location during evaluation: Endoscopy Anesthesia Type: General Level of consciousness: awake and alert Pain management: pain level controlled Vital Signs Assessment: post-procedure vital signs reviewed and stable Respiratory status: spontaneous breathing, nonlabored ventilation, respiratory function stable and patient connected to nasal cannula oxygen Cardiovascular status: blood pressure returned to baseline and stable Postop Assessment: no apparent nausea or vomiting Anesthetic complications: no   No notable events documented.   Last Vitals:  Vitals:   10/01/23 0835 10/01/23 0844  BP: 111/66 95/76  Pulse: 85 80  Resp: (!) 8 10  Temp: (!) 36.1 C   SpO2: 100% 100%    Last Pain:  Vitals:   10/01/23 0844  TempSrc:   PainSc: 0-No pain                 Prentice Murphy

## 2023-10-04 ENCOUNTER — Ambulatory Visit: Payer: Self-pay | Admitting: Gastroenterology

## 2023-10-04 NOTE — Progress Notes (Signed)
 The pathology from colon polyps came back as benign, precancerous.  Recommend surveillance colonoscopy in 3 years  Nicole Chung
